# Patient Record
Sex: Male | Born: 1945 | Race: White | Hispanic: No
Health system: Southern US, Community
[De-identification: ages and names within clinical notes are randomized; demographics above are authoritative.]

---

## 2021-03-11 ENCOUNTER — Emergency Department (HOSPITAL_COMMUNITY)
Admission: EM | Admit: 2021-03-11 | Discharge: 2021-03-12 | Disposition: A | Discharge status: Home / Self Care | Payer: Medicare Other | Attending: Emergency Medicine | Admitting: Emergency Medicine

## 2021-03-11 ENCOUNTER — Emergency Department (HOSPITAL_COMMUNITY): Payer: Medicare Other

## 2021-03-11 ENCOUNTER — Encounter (HOSPITAL_COMMUNITY): Payer: Self-pay | Admitting: *Deleted

## 2021-03-11 DIAGNOSIS — G309 Alzheimer's disease, unspecified: Secondary | ICD-10-CM | POA: Diagnosis not present

## 2021-03-11 DIAGNOSIS — R7309 Other abnormal glucose: Secondary | ICD-10-CM | POA: Diagnosis not present

## 2021-03-11 DIAGNOSIS — R4182 Altered mental status, unspecified: Secondary | ICD-10-CM | POA: Diagnosis present

## 2021-03-11 DIAGNOSIS — F02811 Dementia in other diseases classified elsewhere, unspecified severity, with agitation: Secondary | ICD-10-CM | POA: Insufficient documentation

## 2021-03-11 LAB — COMPREHENSIVE METABOLIC PANEL
ALT: 26 U/L (ref 0–44)
AST: 35 U/L (ref 15–41)
Albumin: 3.1 g/dL — ABNORMAL LOW (ref 3.5–5.0)
Alkaline Phosphatase: 41 U/L (ref 38–126)
Anion gap: 10 (ref 5–15)
BUN: 25 mg/dL — ABNORMAL HIGH (ref 8–23)
CO2: 25 mmol/L (ref 22–32)
Calcium: 8.7 mg/dL — ABNORMAL LOW (ref 8.9–10.3)
Chloride: 103 mmol/L (ref 98–111)
Creatinine, Ser: 0.78 mg/dL (ref 0.61–1.24)
GFR, Estimated: >60 mL/min (ref >60)
Glucose, Bld: 104 mg/dL — ABNORMAL HIGH (ref 70–99)
Potassium: 3.8 mmol/L (ref 3.5–5.1)
Sodium: 138 mmol/L (ref 135–145)
Total Bilirubin: 0.4 mg/dL (ref 0.3–1.2)
Total Protein: 6.6 g/dL (ref 6.5–8.1)

## 2021-03-11 LAB — CBC
HCT: 44.6 % (ref 39.0–52.0)
Hemoglobin: 14.8 g/dL (ref 13.0–17.0)
MCH: 32.7 pg (ref 26.0–34.0)
MCHC: 33.2 g/dL (ref 30.0–36.0)
MCV: 98.5 fL (ref 80.0–100.0)
Platelets: 169 10*3/uL (ref 150–400)
RBC: 4.53 MIL/uL (ref 4.22–5.81)
RDW: 12.7 % (ref 11.5–15.5)
WBC: 6.4 10*3/uL (ref 4.0–10.5)
nRBC: 0 % (ref 0.0–0.2)

## 2021-03-11 LAB — URINALYSIS, ROUTINE W REFLEX MICROSCOPIC
Bilirubin Urine: NEGATIVE
Glucose, UA: NEGATIVE mg/dL
Hgb urine dipstick: NEGATIVE
Ketones, ur: 20 mg/dL — AB
Leukocytes,Ua: NEGATIVE
Nitrite: NEGATIVE
Protein, ur: 30 mg/dL — AB
Specific Gravity, Urine: 1.03 (ref 1.005–1.030)
pH: 5 (ref 5.0–8.0)

## 2021-03-11 LAB — LACTIC ACID, PLASMA: Lactic Acid, Venous: 1.2 mmol/L (ref 0.5–1.9)

## 2021-03-11 LAB — VALPROIC ACID LEVEL: Valproic Acid Lvl: 60 ug/mL (ref 50.0–100.0)

## 2021-03-11 LAB — CBG MONITORING, ED: Glucose-Capillary: 101 mg/dL — ABNORMAL HIGH (ref 70–99)

## 2021-03-11 NOTE — ED Notes (Signed)
Patient is asleep.  

## 2021-03-11 NOTE — ED Notes (Signed)
Tried to put condom cath on pt, pt denies treatment saying he has had enough! Nurse notified

## 2021-03-11 NOTE — ED Provider Notes (Signed)
[facility] EMERGENCY DEPARTMENT Provider Note   CSN: [id number] Arrival date & time: 03/11/21  1247     History Chief Complaint  Patient presents with   Altered Mental Status    Joel Houston is a 75 y.o. male with a history of advanced Alzheimer's disease Alzheimer's disease presenting from his nursing home for further evaluation of altered mental status.  The patient has been increasingly combative, yelling, hitting and spitting at caregivers.  At baseline his mental status is disoriented x4.  Spoke with pt's brother by phone, Joel Houston who confirms that his brother occasionally does become combative and uncooperative and is felt to be part of his Alzheimers process.    Level 5 caveat given Alzheimers dementia.  The history is provided by the nursing home. The history is limited by the condition of the patient.      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   History reviewed. No pertinent surgical history.     No family history on file.     Home Medications Prior to Admission medications   Medication Sig Start Date End Date Taking? Authorizing Provider  acetaminophen (TYLENOL) 500 MG tablet Take 500 mg by mouth in the morning, at noon, and at bedtime.   Yes [provider]  Cholecalciferol (D3) 50 MCG (2000 UT) TABS Take 2,000 Units by mouth daily.   Yes [provider]  citalopram (CELEXA) 10 MG tablet Take 10 mg by mouth daily. 03/04/21  Yes [provider]  citalopram  (CELEXA) 20 MG tablet Take 20 mg by mouth daily. 03/04/21  Yes [provider]  divalproex (DEPAKOTE SPRINKLE) 125 MG capsule Take 500 mg by mouth 2 (two) times daily. 03/03/21  Yes [provider]  latanoprost (XALATAN) 0.005 % ophthalmic solution Place 1 drop into both eyes at bedtime. 02/14/21  Yes [provider]  NAMZARIC 7-10 MG CP24 Take 1 capsule by mouth daily. 02/23/21  Yes [provider]  senna (SENOKOT) 8.6 MG TABS tablet Take 1 tablet by mouth in the morning and at bedtime.   Yes [provider]    Allergies    Patient has no known allergies.  Review of Systems   Review of Systems  Unable to perform ROS: Dementia   Physical Exam Updated Vital Signs BP (!) 144/98 (BP Location: Right Arm)    Pulse 63    Temp (!) 97.2 F (36.2 C) (Rectal)    Resp (!) 21    SpO2 100%   Physical Exam Vitals and nursing note reviewed.  Constitutional:      General: He is not in acute distress.    Appearance: He is well-developed.  HENT:     Head: Normocephalic and atraumatic.  Eyes:     Conjunctiva/sclera: Conjunctivae normal.  Cardiovascular:     Rate and Rhythm: Normal rate and regular rhythm.     Heart sounds: Normal heart sounds.  Pulmonary:     Effort: Pulmonary effort is normal.     Breath sounds: Normal breath sounds. No wheezing.  Abdominal:     General: Bowel sounds are normal. There is no distension.     Palpations: Abdomen is soft.     Tenderness: There is no abdominal tenderness. There is no guarding.  Musculoskeletal:        General: Normal range of motion.     Cervical back: Normal range of motion.  Skin:    General: Skin is warm and dry.  Neurological:     Mental Status: He is disoriented.     Comments: Not cooperative for complete neuro exam.  Moves all extremities.  No facial droop.  Psychiatric:        Attention and Perception: He is inattentive.        Behavior: Behavior is uncooperative.    ED Results /  Procedures / Treatments   Labs (all labs ordered are listed, but only abnormal results are displayed) Labs Reviewed  COMPREHENSIVE METABOLIC PANEL - Abnormal; Notable for the following components:      Result Value   Glucose, Bld 104 (*)    BUN 25 (*)    Calcium 8.7 (*)    Albumin 3.1 (*)    All other components within normal limits  URINALYSIS, ROUTINE W REFLEX MICROSCOPIC - Abnormal; Notable for the following components:   APPearance HAZY (*)    Ketones, ur 20 (*)    Protein, ur 30 (*)    Bacteria, UA RARE (*)    All other components within normal limits  CBG MONITORING, ED - Abnormal; Notable for the following components:   Glucose-Capillary 101 (*)    All other components within normal limits  CULTURE, BLOOD (ROUTINE X 2)  CULTURE, BLOOD (ROUTINE X 2)  URINE CULTURE  CBC  VALPROIC ACID LEVEL  LACTIC ACID, PLASMA    EKG EKG Interpretation  Date/Time:  Wednesday March 11 2021 12:57:33 EST Ventricular Rate:  89 PR Interval:  167 QRS Duration: 89 QT Interval:  400 QTC Calculation: 397 R Axis:   39 Text Interpretation: Sinus rhythm Artifact Paired ventricular premature complexes Low voltage, precordial leads Abnormal R-wave progression, early transition No old tracing to compare Confirmed by Susy Frizzle [phone]) on 03/11/2021 2:58:26 PM  Radiology CT Head Wo Contrast  Result Date: 03/11/2021 CLINICAL DATA:  Mental status change. EXAM: CT HEAD WITHOUT CONTRAST TECHNIQUE: Contiguous axial images were obtained from the base of the skull through the vertex without intravenous contrast. COMPARISON:  None. FINDINGS: Brain: Advanced cerebral atrophy, ventriculomegaly and periventricular white matter disease. No extra-axial fluid collections are identified. No CT findings for acute hemispheric infarction or intracranial hemorrhage. No mass lesions. The brainstem and cerebellum are normal. Incidental giant cisterna magna. Vascular: Scattered vascular calcifications. No  aneurysm or hyperdense vessels. Skull: No skull fracture or bone lesion. Sinuses/Orbits: Scattered paranasal sinus disease. There is moderate mucoperiosteal thickening involving the ethmoid air cells and both maxillary sinuses. Is also near complete opacification of the left half of the sphenoid sinus. The mastoid air cells and middle ear cavities are clear. Other: No scalp lesions or scalp hematoma. IMPRESSION: 1. Advanced cerebral atrophy, ventriculomegaly and periventricular white matter disease. 2. No acute intracranial findings or mass lesions. 3. Paranasal sinus disease. Electronically Signed   By: Rudie Meyer M.D.   On: 03/11/2021 20:06   DG Chest Port 1 View  Result Date: 03/11/2021 CLINICAL DATA:  Altered mental status EXAM: PORTABLE CHEST 1  VIEW COMPARISON:  None. FINDINGS: Shallow inspiration with low lung volumes. Slight elevation of right hemidiaphragm. No consolidation or edema. No pleural effusion or pneumothorax. Cardiomediastinal contours are within normal limits. IMPRESSION: No acute process in the chest. Electronically Signed   By: Guadlupe Spanish M.D.   On: 03/11/2021 16:45    Procedures Procedures   Medications Ordered in ED Medications - No data to display  ED Course  I have reviewed the triage vital signs and the nursing notes.  Pertinent labs & imaging results that were available during my care of the patient were reviewed by me and considered in my medical decision making (see chart for details).    MDM Rules/Calculators/A&P                         Patient with a history of advanced Alzheimer's disease, with increased combativeness today.  He has been intermittently cooperative here, at times has been uncooperative, there was significant delay getting his urine sample for this reason.  There is no clear UTI based on this urinalysis, he does have a few red cells and white cells, rare bacteria, not clearly a UTI, urine culture has been ordered.  His electrolytes are  stable, he does have a slight bump in his BUN at 25, his creatinine is normal 0.78.  Chest x-ray is negative for acute infection.  His lactic acid is normal.  Currently pending blood cultures.  Depakote level also normal.  Pt appears stable for dc back to his nursing facility.        Final Clinical Impression(s) / ED Diagnoses Final diagnoses:  Alzheimer's dementia with agitation, unspecified dementia severity, unspecified timing of dementia onset [facility])    Rx / DC Orders ED Discharge Orders     None        Victoriano Lain 03/11/21 2031    Eber Hong, MD 03/11/21 2356

## 2021-03-11 NOTE — ED Triage Notes (Signed)
Pt in from [facility] via [facility], per report the pt has hx of alzheimers with combative behavior, yelling, hitting, and spitting, pt reported to be altered today, per report pts baseline mental status is disoriented x 4, pt non cooperative upon arrival and swinging at EMS, pt has garbled speech intermittently

## 2021-03-11 NOTE — ED Notes (Signed)
EMS convo called for return to [facility]

## 2021-03-11 NOTE — ED Notes (Signed)
PA notified re: pts rectal temp 97.2

## 2021-03-11 NOTE — ED Notes (Signed)
Patient is sleeping in bed.

## 2021-03-11 NOTE — ED Notes (Signed)
Back from CT

## 2021-03-11 NOTE — Discharge Instructions (Addendum)
Todays lab work up, chest xray and exam are reassuring since we have found no significant abnormalities.  It appears todays agitation and aggressiveness may be part of the Alzheimers process.  Please have him re-evaluated by his primary provider if these episodes continue to occur.  He has been relatively cooperative while here.

## 2021-03-11 NOTE — ED Notes (Signed)
Patient transported to CT 

## 2021-03-11 NOTE — ED Notes (Signed)
Patient repositioned in bed. Patient responds to pain and voice. Speech is clearing when talking however patient remains disoriented and confused.

## 2021-03-12 NOTE — ED Notes (Signed)
Report Given to Nurse, children's at [facility].

## 2021-03-12 NOTE — ED Notes (Signed)
Transport is here

## 2021-03-13 LAB — URINE CULTURE: Culture: NO GROWTH

## 2021-03-16 LAB — CULTURE, BLOOD (ROUTINE X 2)
Culture: NO GROWTH
Culture: NO GROWTH
Special Requests: ADEQUATE
Special Requests: ADEQUATE

## 2022-12-24 IMAGING — DX DG CHEST 1V PORT
1 series · 1 of 1 positions shown · non-contrast
Comparison: None.

CLINICAL DATA: Altered mental status

EXAM:
PORTABLE CHEST 1 VIEW

[chest ap]
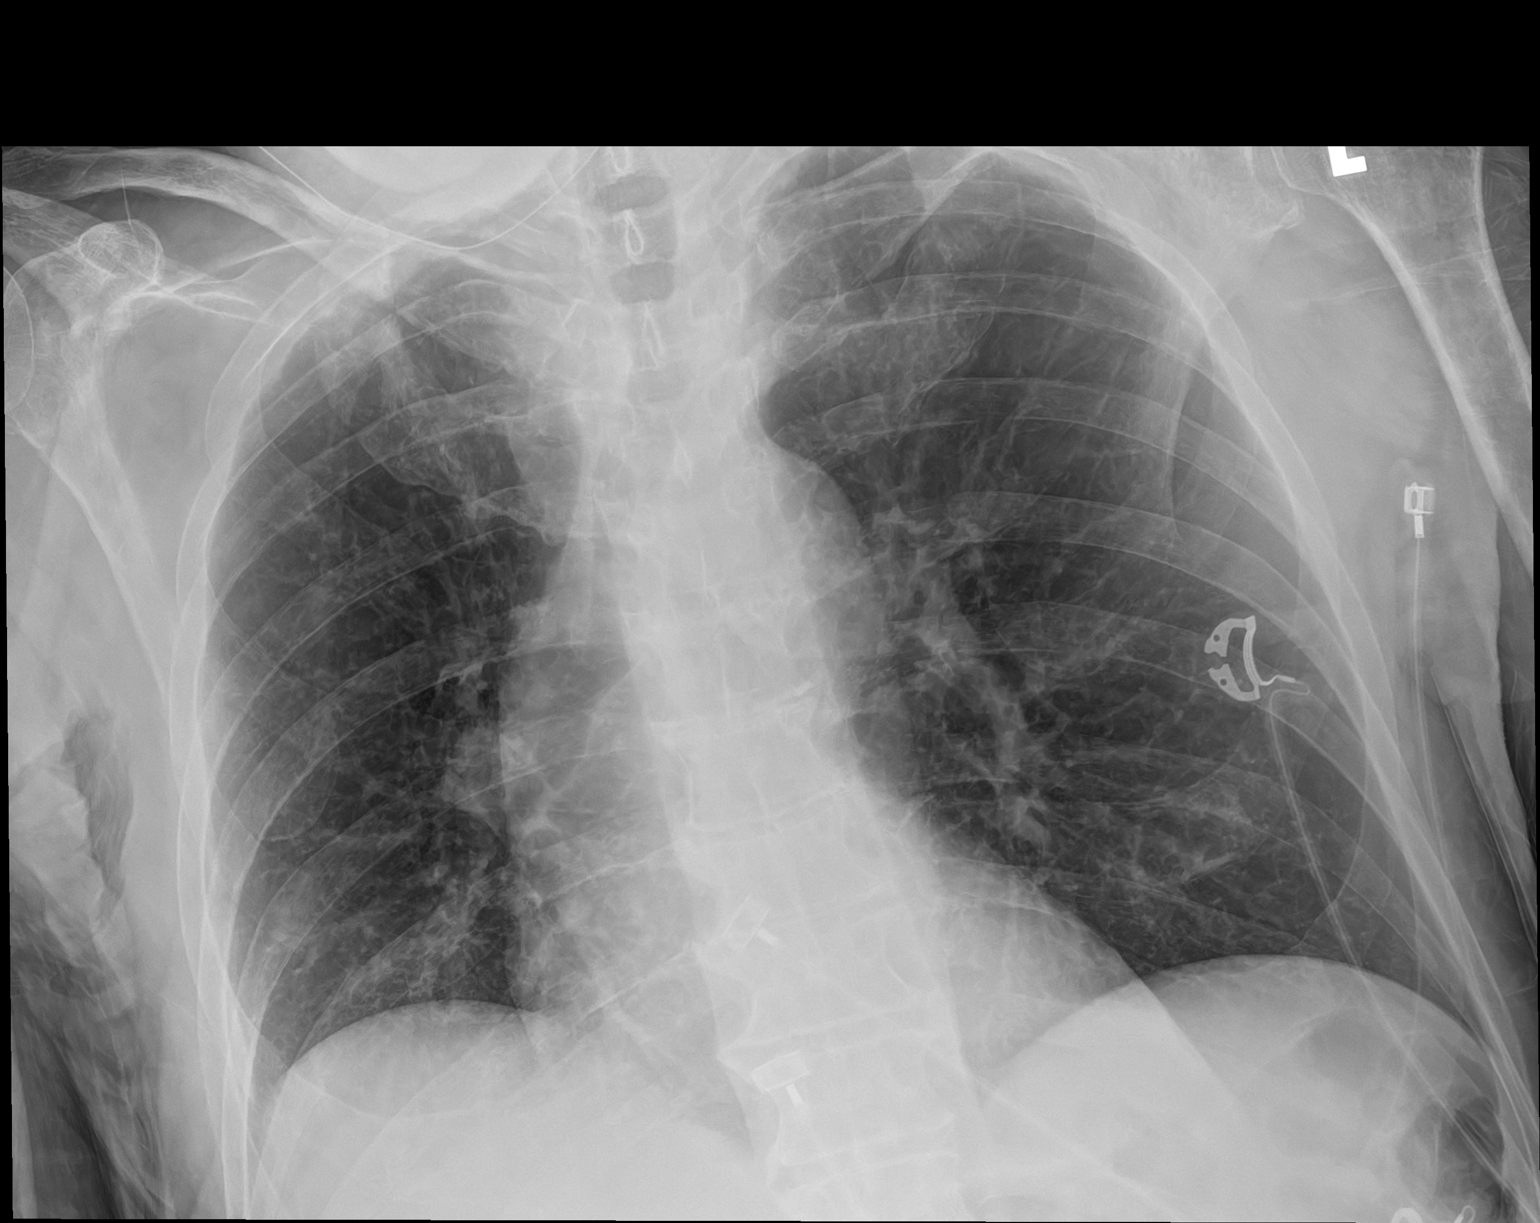

[1 of 1 positions shown; findings below may reference images not displayed]

FINDINGS: Shallow inspiration with low lung volumes. Slight elevation of right
hemidiaphragm. No consolidation or edema. No pleural effusion or
pneumothorax. Cardiomediastinal contours are within normal limits.
IMPRESSION: No acute process in the chest.

## 2022-12-24 IMAGING — CT CT HEAD W/O CM
3 of 4 series · 14 of 47 positions shown, 16 images · non-contrast
Comparison: None.

CLINICAL DATA: Mental status change.

EXAM:
CT HEAD WITHOUT CONTRAST
TECHNIQUE: Contiguous axial images were obtained from the base of the skull
through the vertex without intravenous contrast.

[Series 3: head ax w o · axial · 0.34mm/px · z∈[-20,+121]mm · 8 of 35 slices shown, 10 images]
[im 3/35  brain]
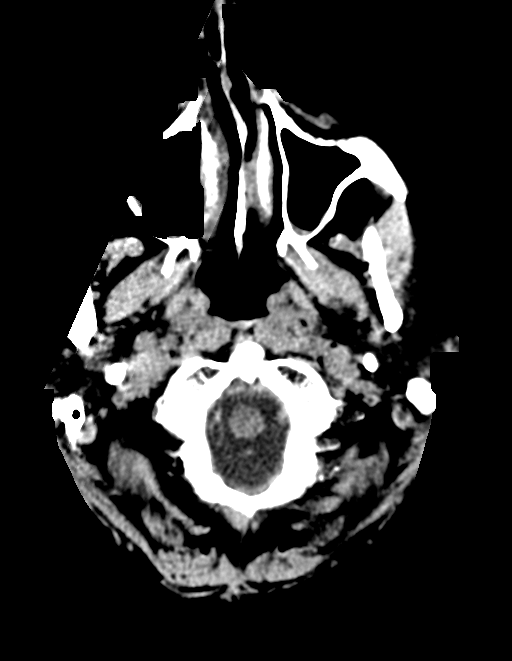
[im 3/35  bone]
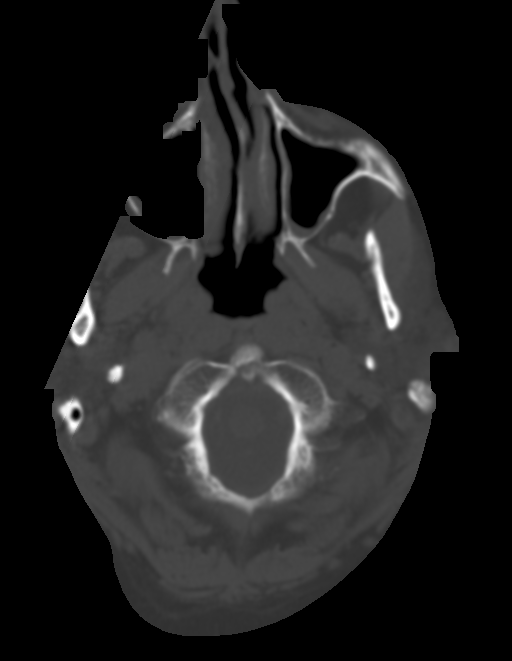
[im 8/35  brain]
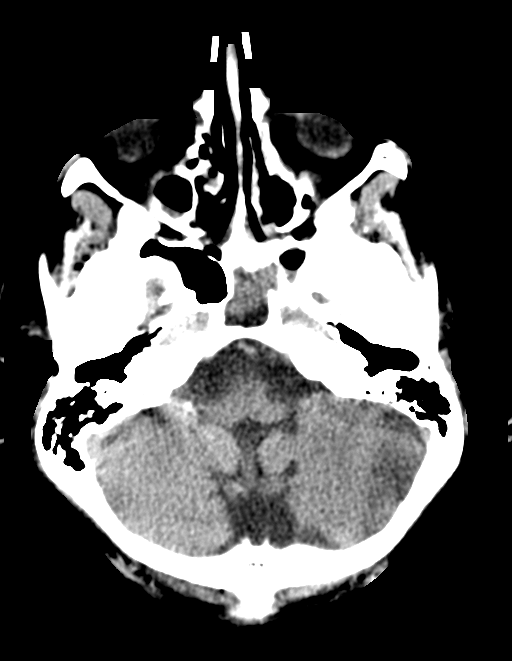
[im 13/35  brain]
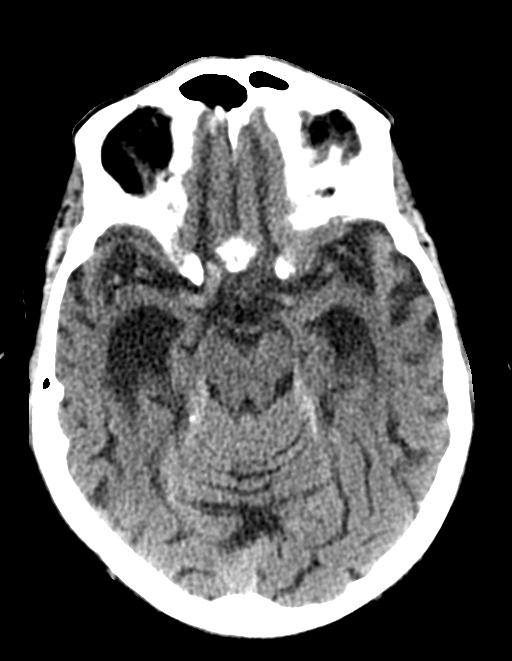
[im 15/35  brain]
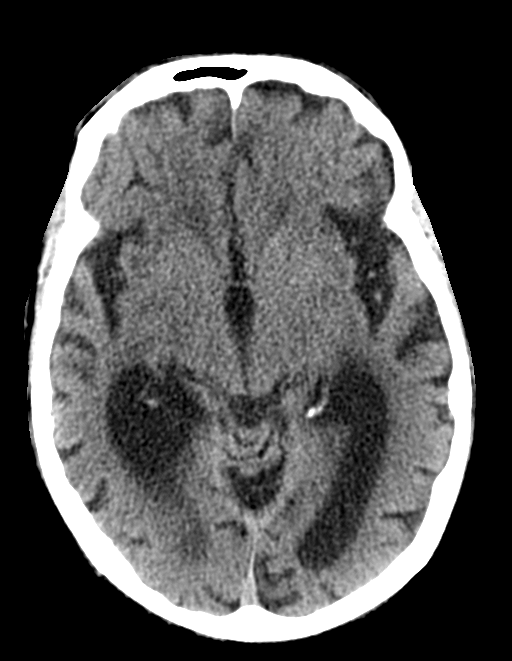
[im 20/35  brain]
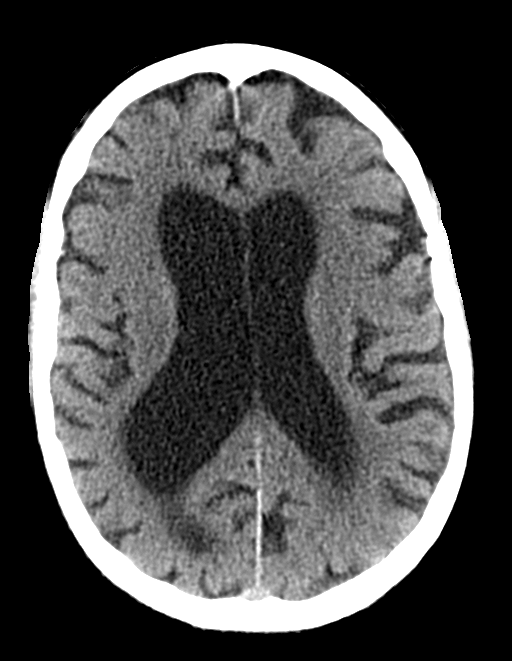
[im 20/35  bone]
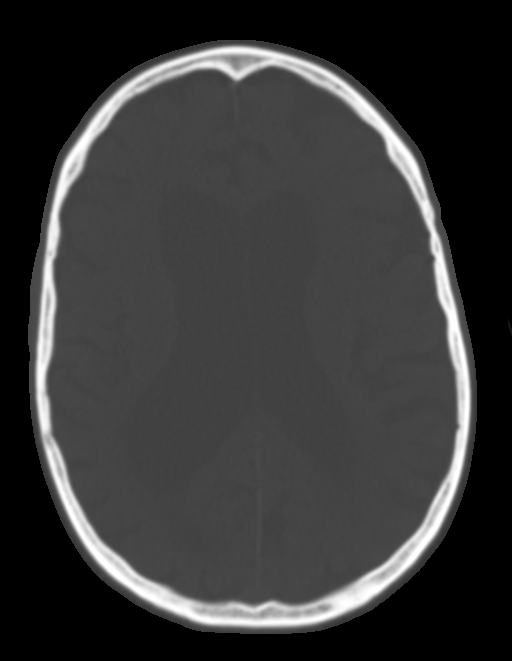
[im 22/35  brain]
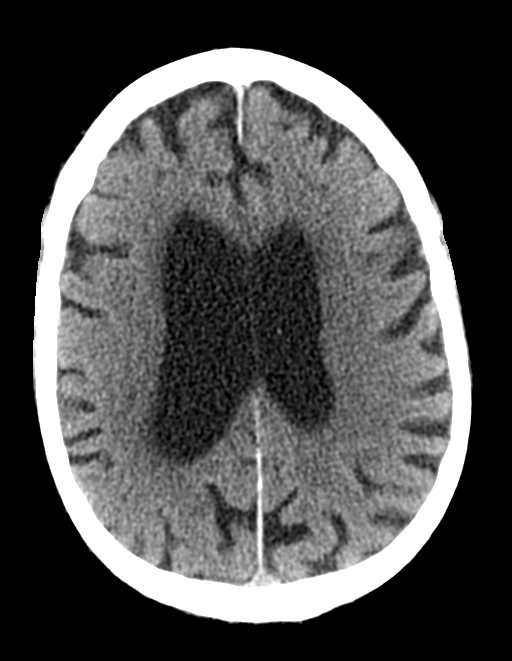
[im 27/35  brain]
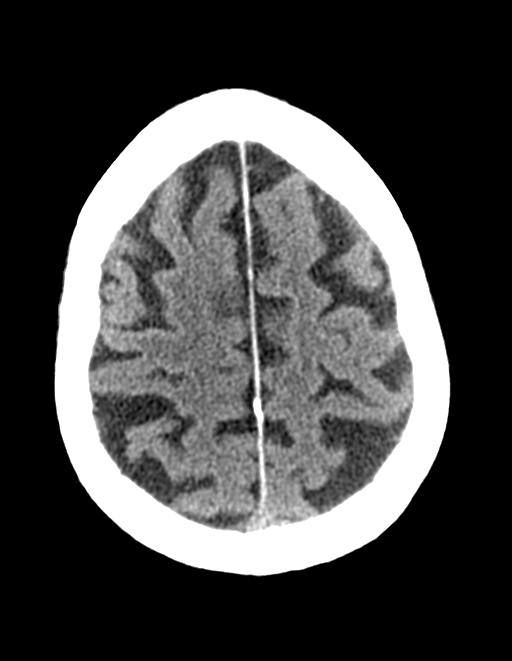
[im 32/35  brain]
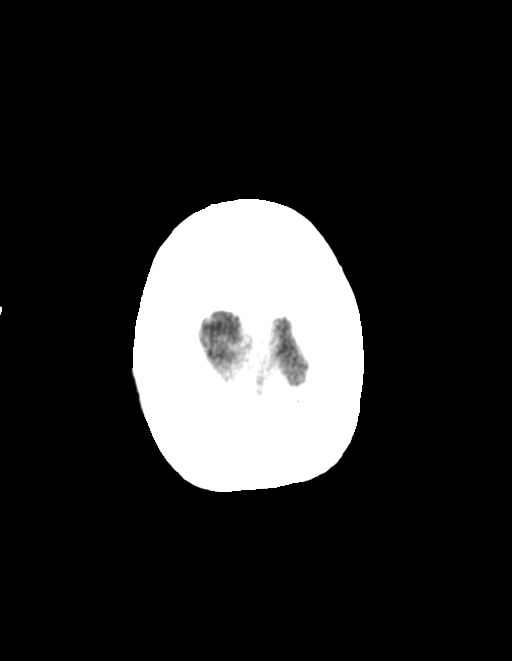

[Series 5: coronal soft · coronal · 0.35mm/px · 3 of 72 slices shown]
[im 24/72  brain]
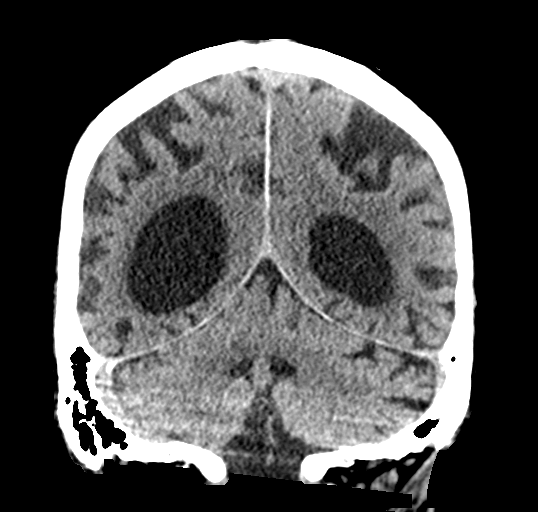
[im 32/72  brain]
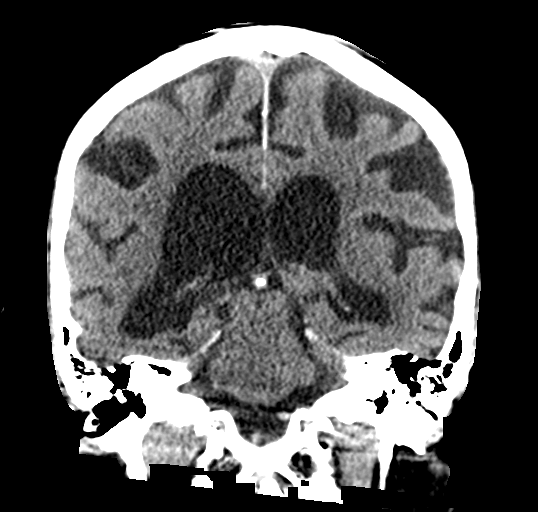
[im 40/72  brain]
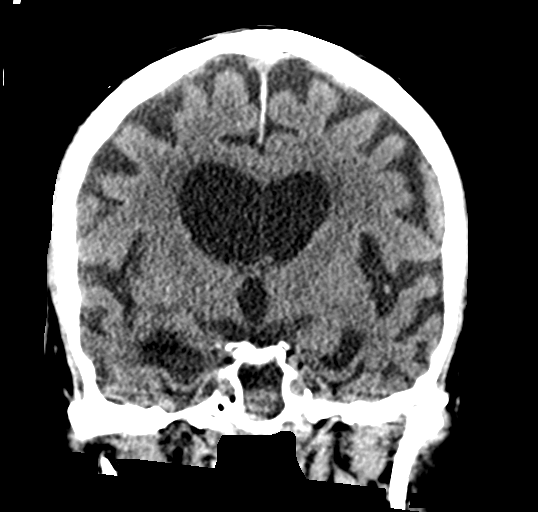

[Series 6: sagittal soft · sagittal · 0.38mm/px · 3 of 56 slices shown]
[im 24/56  brain]
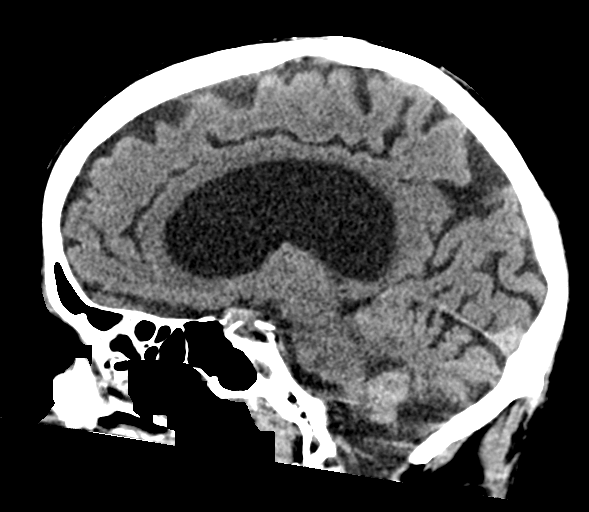
[im 28/56  brain]
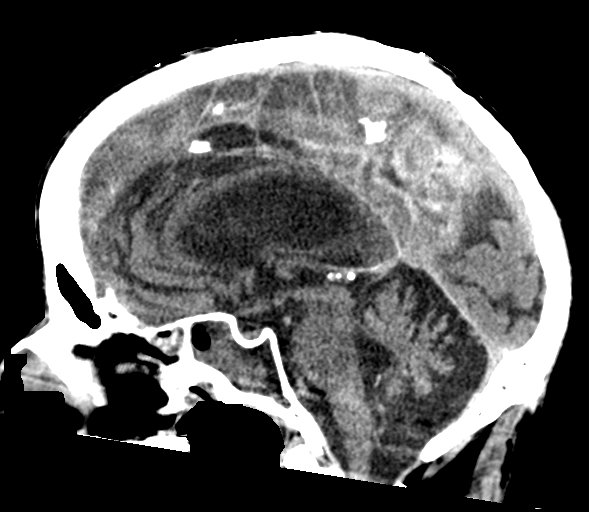
[im 33/56  brain]
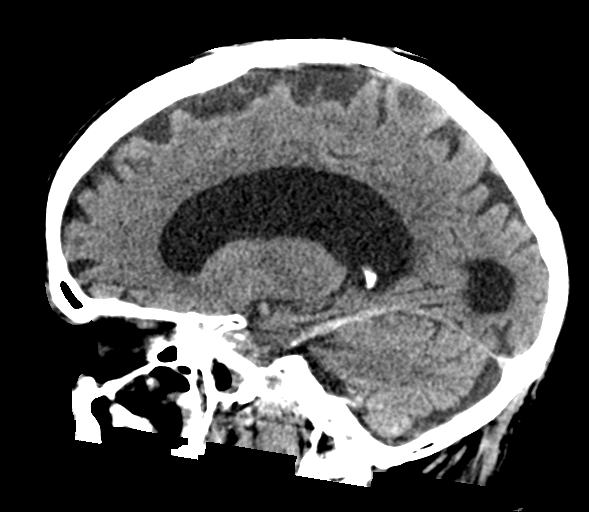

[14 of 47 positions shown; findings below may reference images not displayed]

FINDINGS: Brain: Advanced cerebral atrophy, ventriculomegaly and
periventricular white matter disease. No extra-axial fluid
collections are identified. No CT findings for acute hemispheric
infarction or intracranial hemorrhage. No mass lesions. The
brainstem and cerebellum are normal. Incidental giant cisterna
magna.

Vascular: Scattered vascular calcifications. No aneurysm or
hyperdense vessels.

Skull: No skull fracture or bone lesion.

Sinuses/Orbits: Scattered paranasal sinus disease. There is moderate
mucoperiosteal thickening involving the ethmoid air cells and both
maxillary sinuses. Is also near complete opacification of the left
half of the sphenoid sinus. The mastoid air cells and middle ear
cavities are clear.

Other: No scalp lesions or scalp hematoma.
IMPRESSION: 1. Advanced cerebral atrophy, ventriculomegaly and periventricular
white matter disease.
2. No acute intracranial findings or mass lesions.
3. Paranasal sinus disease.
